# Patient Record
Sex: Male | Born: 1995 | Race: Black or African American | Hispanic: No | Marital: Married | Smoking: Current every day smoker
Health system: Southern US, Community
[De-identification: ages and names within clinical notes are randomized; demographics above are authoritative.]

## PROBLEM LIST (undated history)

## (undated) HISTORY — PX: TONSILLECTOMY: SUR1361

---
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
## 2013-02-11 ENCOUNTER — Emergency Department (HOSPITAL_COMMUNITY)
Admission: EM | Admit: 2013-02-11 | Discharge: 2013-02-11 | Disposition: A | Discharge status: Home / Self Care | Payer: Medicaid Other | Attending: Emergency Medicine | Admitting: Emergency Medicine

## 2013-02-11 ENCOUNTER — Encounter (HOSPITAL_COMMUNITY): Payer: Self-pay | Admitting: Emergency Medicine

## 2013-02-11 DIAGNOSIS — Z23 Encounter for immunization: Secondary | ICD-10-CM | POA: Insufficient documentation

## 2013-02-11 DIAGNOSIS — W261XXA Contact with sword or dagger, initial encounter: Secondary | ICD-10-CM | POA: Insufficient documentation

## 2013-02-11 DIAGNOSIS — W260XXA Contact with knife, initial encounter: Secondary | ICD-10-CM | POA: Insufficient documentation

## 2013-02-11 DIAGNOSIS — Y998 Other external cause status: Secondary | ICD-10-CM | POA: Insufficient documentation

## 2013-02-11 DIAGNOSIS — S61209A Unspecified open wound of unspecified finger without damage to nail, initial encounter: Secondary | ICD-10-CM | POA: Insufficient documentation

## 2013-02-11 DIAGNOSIS — S61219A Laceration without foreign body of unspecified finger without damage to nail, initial encounter: Secondary | ICD-10-CM

## 2013-02-11 DIAGNOSIS — Y929 Unspecified place or not applicable: Secondary | ICD-10-CM | POA: Insufficient documentation

## 2013-02-11 MED ORDER — TETANUS-DIPHTH-ACELL PERTUSSIS 5-2.5-18.5 LF-MCG/0.5 IM SUSP
0.5000 mL | Freq: Once | INTRAMUSCULAR | Status: AC
  Administered 2013-02-11: 0.5 mL via INTRAMUSCULAR
  Filled 2013-02-11: qty 0.5

## 2013-02-11 NOTE — ED Provider Notes (Signed)
History    This chart was scribed for Doran Durand, non-physician practitioner working with Richardean Canal, MD by Leone Payor, ED Scribe. This patient was seen in room TR04C/TR04C and the patient's care was started at 1352.   CSN: [id number]  Arrival date & time 02/11/13  1352   First MD Initiated Contact with Patient 02/11/13 1504      Chief Complaint  Patient presents with  . Extremity Laceration     The history is provided by the patient. No language interpreter was used.    HPI Comments: Cole Hall is a 17 y.o. male who presents to the Emergency Department complaining of a laceration to the L index finger that occurred about 1.5 hours ago. Pt states he was attempting to close a knife blade when the cut happened. He does not remember if tetanus is UTD. He did not try any at-home treatment PTA. He denies any numbness or tingling.  Bleeding controlled at this time.  Full ROM of the finger.   History reviewed. No pertinent past medical history.  History reviewed. No pertinent past surgical history.  No family history on file.  History  Substance Use Topics  . Smoking status: Not on file  . Smokeless tobacco: Not on file  . Alcohol Use: No      Review of Systems  Skin: Positive for wound (laceration ).  Neurological: Negative for numbness.  All other systems reviewed and are negative.    Allergies  Review of patient's allergies indicates no known allergies.  Home Medications  No current outpatient prescriptions on file.  BP 134/70  Pulse 56  Temp(Src) 98.5 F (36.9 C) (Oral)  Resp 15  SpO2 98%  Physical Exam  Nursing note and vitals reviewed. Constitutional: He is oriented to person, place, and time. He appears well-developed and well-nourished. No distress.  HENT:  Head: Normocephalic and atraumatic.  Eyes: Conjunctivae and EOM are normal.  Neck: Normal range of motion.  Cardiovascular: Normal rate, regular rhythm, normal heart sounds and intact  distal pulses.   Pulmonary/Chest: Effort normal and breath sounds normal. No respiratory distress. He has no wheezes. He has no rales.  Musculoskeletal: Normal range of motion.  Good ROM of DIP and PIP of his left index finger. Able to resist.   Neurological: He is alert and oriented to person, place, and time.  Sensation intact.   Skin: Skin is warm and dry. No rash noted. He is not diaphoretic.  1.5 cm V-shaped linear laceration. Good cap refill of the L index finger   Psychiatric: He has a normal mood and affect. His behavior is normal.    ED Course  Procedures (including critical care time)  DIAGNOSTIC STUDIES: Oxygen Saturation is 98% on room air, normal by my interpretation.    COORDINATION OF CARE: 3:28 PM Discussed treatment plan with pt at bedside and pt agreed to plan.   LACERATION REPAIR Performed by: Doran Durand Consent: Verbal consent obtained. Risks and benefits: risks, benefits and alternatives were discussed Patient identity confirmed: provided demographic data Time out performed prior to procedure Prepped and Draped in normal sterile fashion Wound explored Laceration Location: Left index finger pad Laceration Length: 1.5 cm No Foreign Bodies seen or palpated Anesthesia: local infiltration Local anesthetic: lidocaine 2% w/o epinephrine Anesthetic total: 5 ml Irrigation method: syringe Amount of cleaning: standard Skin closure: 4-0 vicryo rapide Number of sutures or staples: 4 Technique: simple, interrupted  Patient tolerance: Patient tolerated the procedure well with no immediate complications.  Labs Reviewed - No data to display No results found.   No diagnosis found.    MDM  Patient presents with a laceration of his left index finger pad.  No tendon involvement.  Neurovascularly intact.  Laceration repaired with sutures.  Patient stable for discharge.  I personally performed the services described in this documentation, which was scribed  in my presence. The recorded information has been reviewed and is accurate.    Pascal Lux Boissevain, PA-C 02/11/13 1737

## 2013-02-11 NOTE — ED Notes (Signed)
Pt. Stated, i was closing a blade and it cut my left finger.

## 2013-02-16 NOTE — ED Provider Notes (Signed)
Medical screening examination/treatment/procedure(s) were performed by non-physician practitioner and as supervising physician I was immediately available for consultation/collaboration.   Richardean Canal, MD 02/16/13 0930

## 2017-05-25 ENCOUNTER — Encounter (HOSPITAL_COMMUNITY): Payer: Self-pay | Admitting: Emergency Medicine

## 2017-05-25 DIAGNOSIS — Z7253 High risk bisexual behavior: Secondary | ICD-10-CM | POA: Insufficient documentation

## 2017-05-25 DIAGNOSIS — Z202 Contact with and (suspected) exposure to infections with a predominantly sexual mode of transmission: Secondary | ICD-10-CM | POA: Insufficient documentation

## 2017-05-25 DIAGNOSIS — F17200 Nicotine dependence, unspecified, uncomplicated: Secondary | ICD-10-CM | POA: Insufficient documentation

## 2017-05-25 LAB — URINALYSIS, ROUTINE W REFLEX MICROSCOPIC
Bilirubin Urine: NEGATIVE
Glucose, UA: NEGATIVE mg/dL
Hgb urine dipstick: NEGATIVE
Ketones, ur: NEGATIVE mg/dL
Leukocytes, UA: NEGATIVE
Nitrite: NEGATIVE
Protein, ur: NEGATIVE mg/dL
SPECIFIC GRAVITY, URINE: 1.025 (ref 1.005–1.030)
pH: 5 (ref 5.0–8.0)

## 2017-05-25 NOTE — ED Triage Notes (Signed)
Pt wants to be checked for STDs, denies symptoms, concerned for exposure.

## 2017-05-26 ENCOUNTER — Emergency Department (HOSPITAL_COMMUNITY)
Admission: EM | Admit: 2017-05-26 | Discharge: 2017-05-26 | Disposition: A | Discharge status: Home / Self Care | Payer: Self-pay | Attending: Emergency Medicine | Admitting: Emergency Medicine

## 2017-05-26 DIAGNOSIS — Z7251 High risk heterosexual behavior: Secondary | ICD-10-CM

## 2017-05-26 LAB — GC/CHLAMYDIA PROBE AMP (~~LOC~~) NOT AT ARMC
Chlamydia: NEGATIVE
Neisseria Gonorrhea: NEGATIVE

## 2017-05-26 LAB — HIV ANTIBODY (ROUTINE TESTING W REFLEX): HIV Screen 4th Generation wRfx: NONREACTIVE

## 2017-05-26 LAB — RPR: RPR: NONREACTIVE

## 2017-05-26 NOTE — Discharge Instructions (Signed)
Your urine today was not concerning for possible STD exposure. You have STD tests pending. Follow-up with the health department in 48 hours for the results of your STD tests. However, you WILL receive a call if your STD results return POSITIVE. If you test positive for STDs, notify all sexual partners of their need to be tested and treated as well. Use a condom when sexually active.

## 2017-05-26 NOTE — ED Provider Notes (Signed)
[facility]-EMERGENCY DEPT Provider Note   CSN: [id number] Arrival date & time: 05/25/17  2202     History   Chief Complaint Chief Complaint  Patient presents with  . Exposure to STD    HPI Cole Hall is a 21 y.o. male.  21 year old male presents to the emergency department for concern of STD exposure. He states that he was sexually active with a partner 3-4 months ago and was notified today that she may be positive for an STD. Patient reports intercourse to be unprotected. Patient denies any symptoms today. He has no abdominal pain, fever, nausea, vomiting, genital sores or lesions, penile discharge, dysuria, testicular tenderness, or scrotal swelling.      History reviewed. No pertinent past medical history.  There are no active problems to display for this patient.   History reviewed. No pertinent surgical history.     Home Medications    Prior to Admission medications   Not on File    Family History No family history on file.  Social History Social History  Substance Use Topics  . Smoking status: Current Every Day Smoker  . Smokeless tobacco: Never Used  . Alcohol use No     Allergies   Patient has no known allergies.   Review of Systems Review of Systems Ten systems reviewed and are negative for acute change, except as noted in the HPI.    Physical Exam Updated Vital Signs BP 135/88 (BP Location: Left Arm)   Pulse (!) 49   Temp 98.4 F (36.9 C) (Oral)   Resp 16   Ht  (1.727 m)   Wt 74.8 kg (165 lb)   SpO2 95%   BMI 25.09 kg/m   Physical Exam  Constitutional: He is oriented to person, place, and time. He appears well-developed and well-nourished. No distress.  HENT:  Head: Normocephalic and atraumatic.  Eyes: Conjunctivae and EOM are normal. No scleral icterus.  Neck: Normal range of motion.  Pulmonary/Chest: Effort normal. No respiratory distress.  Genitourinary: Right testis shows no mass, no swelling and no tenderness. Right  testis is descended. Left testis shows no mass, no swelling and no tenderness. Left testis is descended. Circumcised. No discharge found.  Genitourinary Comments: Normal genital exam; chaperoned by Mitzie Na, RN.  Musculoskeletal: Normal range of motion.  Lymphadenopathy: No inguinal adenopathy noted on the right or left side.  Neurological: He is alert and oriented to person, place, and time.  Skin: Skin is warm and dry. No rash noted. He is not diaphoretic. No erythema. No pallor.  Psychiatric: He has a normal mood and affect. His behavior is normal.  Nursing note and vitals reviewed.    ED Treatments / Results  Labs (all labs ordered are listed, but only abnormal results are displayed) Labs Reviewed  URINALYSIS, ROUTINE W REFLEX MICROSCOPIC  RPR  HIV ANTIBODY (ROUTINE TESTING)  GC/CHLAMYDIA PROBE AMP ([place]) NOT AT [facility]    EKG  EKG Interpretation None       Radiology No results found.  Procedures Procedures (including critical care time)  Medications Ordered in ED Medications - No data to display   Initial Impression / Assessment and Plan / ED Course  I have reviewed the triage vital signs and the nursing notes.  Pertinent labs & imaging results that were available during my care of the patient were reviewed by me and considered in my medical decision making (see chart for details).     21 year old male presents after an unprotected sexual  encounter for STD check. He has no signs of acute infection. UA negative for pyuria. No indication for prophylactic management. STD tests are pending. The patient is aware of his need to tell all sexual partners of their need to be tested and treated for STDs as well. He has been counseled on the use of protection during intercourse. Patient to follow-up with the Health Department regarding the results of his STD tests. Return precautions discussed and provided. Patient discharged in stable condition with no unaddressed  concerns.   Final Clinical Impressions(s) / ED Diagnoses   Final diagnoses:  History of unprotected sex    New Prescriptions There are no discharge medications for this patient.    Antony Madura, PA-C 05/26/17 Aniceto Boss    Zadie Rhine, MD 05/26/17 [phone]

## 2018-07-26 ENCOUNTER — Emergency Department (HOSPITAL_COMMUNITY)
Admission: EM | Admit: 2018-07-26 | Discharge: 2018-07-27 | Disposition: A | Discharge status: Home / Self Care | Attending: Emergency Medicine | Admitting: Emergency Medicine

## 2018-07-26 ENCOUNTER — Encounter (HOSPITAL_COMMUNITY): Payer: Self-pay

## 2018-07-26 ENCOUNTER — Emergency Department (HOSPITAL_COMMUNITY)

## 2018-07-26 DIAGNOSIS — R079 Chest pain, unspecified: Secondary | ICD-10-CM | POA: Diagnosis present

## 2018-07-26 DIAGNOSIS — F17200 Nicotine dependence, unspecified, uncomplicated: Secondary | ICD-10-CM | POA: Diagnosis not present

## 2018-07-26 DIAGNOSIS — I493 Ventricular premature depolarization: Secondary | ICD-10-CM | POA: Diagnosis not present

## 2018-07-26 LAB — CBC
HCT: 46.7 % (ref 39.0–52.0)
Hemoglobin: 15.5 g/dL (ref 13.0–17.0)
MCH: 31.7 pg (ref 26.0–34.0)
MCHC: 33.2 g/dL (ref 30.0–36.0)
MCV: 95.5 fL (ref 80.0–100.0)
NRBC: 0 % (ref 0.0–0.2)
Platelets: 234 10*3/uL (ref 150–400)
RBC: 4.89 MIL/uL (ref 4.22–5.81)
RDW: 11.7 % (ref 11.5–15.5)
WBC: 10 10*3/uL (ref 4.0–10.5)

## 2018-07-26 LAB — BASIC METABOLIC PANEL
ANION GAP: 12 (ref 5–15)
BUN: 8 mg/dL (ref 6–20)
CO2: 24 mmol/L (ref 22–32)
Calcium: 9.6 mg/dL (ref 8.9–10.3)
Chloride: 104 mmol/L (ref 98–111)
Creatinine, Ser: 1.11 mg/dL (ref 0.61–1.24)
GFR calc Af Amer: >60 mL/min (ref >60)
GLUCOSE: 99 mg/dL (ref 70–99)
POTASSIUM: 3 mmol/L — AB (ref 3.5–5.1)
Sodium: 140 mmol/L (ref 135–145)

## 2018-07-26 LAB — I-STAT TROPONIN, ED: Troponin i, poc: 0.03 ng/mL (ref 0.00–0.08)

## 2018-07-26 MED ORDER — POTASSIUM CHLORIDE CRYS ER 20 MEQ PO TBCR
60.0000 meq | EXTENDED_RELEASE_TABLET | Freq: Once | ORAL | Status: AC
  Administered 2018-07-27: 60 meq via ORAL
  Filled 2018-07-26: qty 3

## 2018-07-26 NOTE — ED Provider Notes (Signed)
[facility]   CSN: [id number] Arrival date & time: 07/26/18  2038     History   Chief Complaint Chief Complaint  Patient presents with  . Chest Pain    HPI Queen Sloughhomas G Minnich is a 22 y.o. male.  HPI  22 year old male comes in with chief complaint of chest pain.  Patient reports that he was watching television earlier in the evening when he suddenly started having chest pain.  Chest pain is described as midsternal pain and it was preceded by palpitations.  Patient states that his symptoms lasted for several minutes.  Patient had associated dizziness but no near fainting, and he denies any nausea, shortness of breath.  Patient's father had a heart attack in his 2940s otherwise family history is mostly unremarkable.  Patient admits to heavy drinking over the weekends and he denies any substance abuse.   History reviewed. No pertinent past medical history.  There are no active problems to display for this patient.   Past Surgical History:  Procedure Laterality Date  . TONSILLECTOMY          Home Medications    Prior to Admission medications   Not on File    Family History No family history on file.  Social History Social History   Tobacco Use  . Smoking status: Current Every Day Smoker  . Smokeless tobacco: Never Used  Substance Use Topics  . Alcohol use: No  . Drug use: No     Allergies   Patient has no known allergies.   Review of Systems Review of Systems  Constitutional: Positive for activity change.  Respiratory: Negative for shortness of breath.   Cardiovascular: Positive for chest pain and palpitations.  Gastrointestinal: Positive for nausea.  All other systems reviewed and are negative.    Physical Exam Updated Vital Signs BP 134/80   Pulse (!) 49   Temp 98.5 F (36.9 C) (Oral)   Resp (!) 21   SpO2 99%   Physical Exam  Constitutional: He is oriented to person, place, and time. He  appears well-developed.  HENT:  Head: Atraumatic.  Neck: Neck supple.  Cardiovascular: Intact distal pulses and normal pulses. An irregularly irregular rhythm present.  Pulmonary/Chest: Effort normal. Tachypneic:   Neurological: He is alert and oriented to person, place, and time.  Skin: Skin is warm.  Nursing note and vitals reviewed.    ED Treatments / Results  Labs (all labs ordered are listed, but only abnormal results are displayed) Labs Reviewed  BASIC METABOLIC PANEL - Abnormal; Notable for the following components:      Result Value   Potassium 3.0 (*)    All other components within normal limits  CBC  MAGNESIUM  MAGNESIUM  I-STAT TROPONIN, ED  I-STAT TROPONIN, ED    EKG EKG Interpretation  Date/Time:  Wednesday July 26 2018 20:49:39 EST Ventricular Rate:  86 PR Interval:  140 QRS Duration: 106 QT Interval:  358 QTC Calculation: 428 R Axis:   67 Text Interpretation:  Sinus rhythm with sinus arrhythmia with occasional Premature ventricular complexes Nonspecific T wave abnormality Abnormal ECG No acute changes lateral leads have new TWI Confirmed by Derwood Kaplananavati, Carrell Palmatier [phone]([id number]) on 07/26/2018 11:07:41 PM   Radiology Dg Chest 2 View  Result Date: 07/26/2018 CLINICAL DATA:  Chest pain. Nausea and shortness of breath. EXAM: CHEST - 2 VIEW COMPARISON:  None. FINDINGS: The cardiomediastinal contours are normal. The lungs are clear. Pulmonary vasculature is normal. No consolidation, pleural  effusion, or pneumothorax. No acute osseous abnormalities are seen. IMPRESSION: Negative radiographs of the chest Electronically Signed   By: Narda Rutherford M.D.   On: 07/26/2018 21:10    Procedures Procedures (including critical care time)  Medications Ordered in ED Medications  potassium chloride SA (K-DUR,KLOR-CON) CR tablet 60 mEq (60 mEq Oral Given 07/27/18 0023)     Initial Impression / Assessment and Plan / ED Course  I have reviewed the triage vital signs and the  nursing notes.  Pertinent labs & imaging results that were available during my care of the patient were reviewed by me and considered in my medical decision making (see chart for details).     22 year old male comes in with chief complaint of chest discomfort.  Patient reports that he had sudden onset of palpitations followed by chest discomfort, his symptoms lasted for several minutes.  Chest pain is described as tightness and it was located midsternally.  Patient is now chest pain-free.  He is in the [employer] and denies any significant medical problems.  He does indicate that he binge drinks over the weekend.  Family history is unremarkable besides his father having heart disease in his upper 44s.   EKG shows T wave inversions in the lateral leads and he is noted to have PVCs. Potassium is slightly low, we will give him oral K.\  1:49 AM Results from the ER workup discussed with the patient face to face and all questions answered to the best of my ability.  Pt will be discharged if trop is neg.   Final Clinical Impressions(s) / ED Diagnoses   Final diagnoses:  PVC (premature ventricular contraction)  Nonspecific chest pain    ED Discharge Orders    None       Derwood Kaplan, MD 07/27/18 0149

## 2018-07-26 NOTE — ED Triage Notes (Signed)
Pt states that he began to feel like his heart was racing about 30 minutes ago with some nausea and SOB. Pt reports feeling anxious. CP is central

## 2018-07-27 LAB — MAGNESIUM: MAGNESIUM: 2.4 mg/dL (ref 1.7–2.4)

## 2018-07-27 LAB — I-STAT TROPONIN, ED: Troponin i, poc: 0 ng/mL (ref 0.00–0.08)

## 2018-07-27 MED ORDER — SODIUM CHLORIDE 0.9 % IV SOLN: INTRAVENOUS | Status: DC | PRN

## 2018-07-27 NOTE — ED Notes (Signed)
Patient verbalizes understanding of medications and discharge instructions. No further questions at this time. VSS and patient ambulatory at discharge.   

## 2018-07-27 NOTE — Discharge Instructions (Addendum)
We saw you in the ER for the palpitations. We noted that you have extra beats. We are unsure what rhythm you had when you had chest palpitations. If the symptoms recur, we would recommend that you see a primary care doctor or specialist who can consider Holter monitoring.

## 2020-06-23 IMAGING — CR DG CHEST 2V
2 series · 2 of 2 positions shown · non-contrast
Comparison: None.

CLINICAL DATA: Chest pain. Nausea and shortness of breath.

EXAM:
CHEST - 2 VIEW

[chest lat]
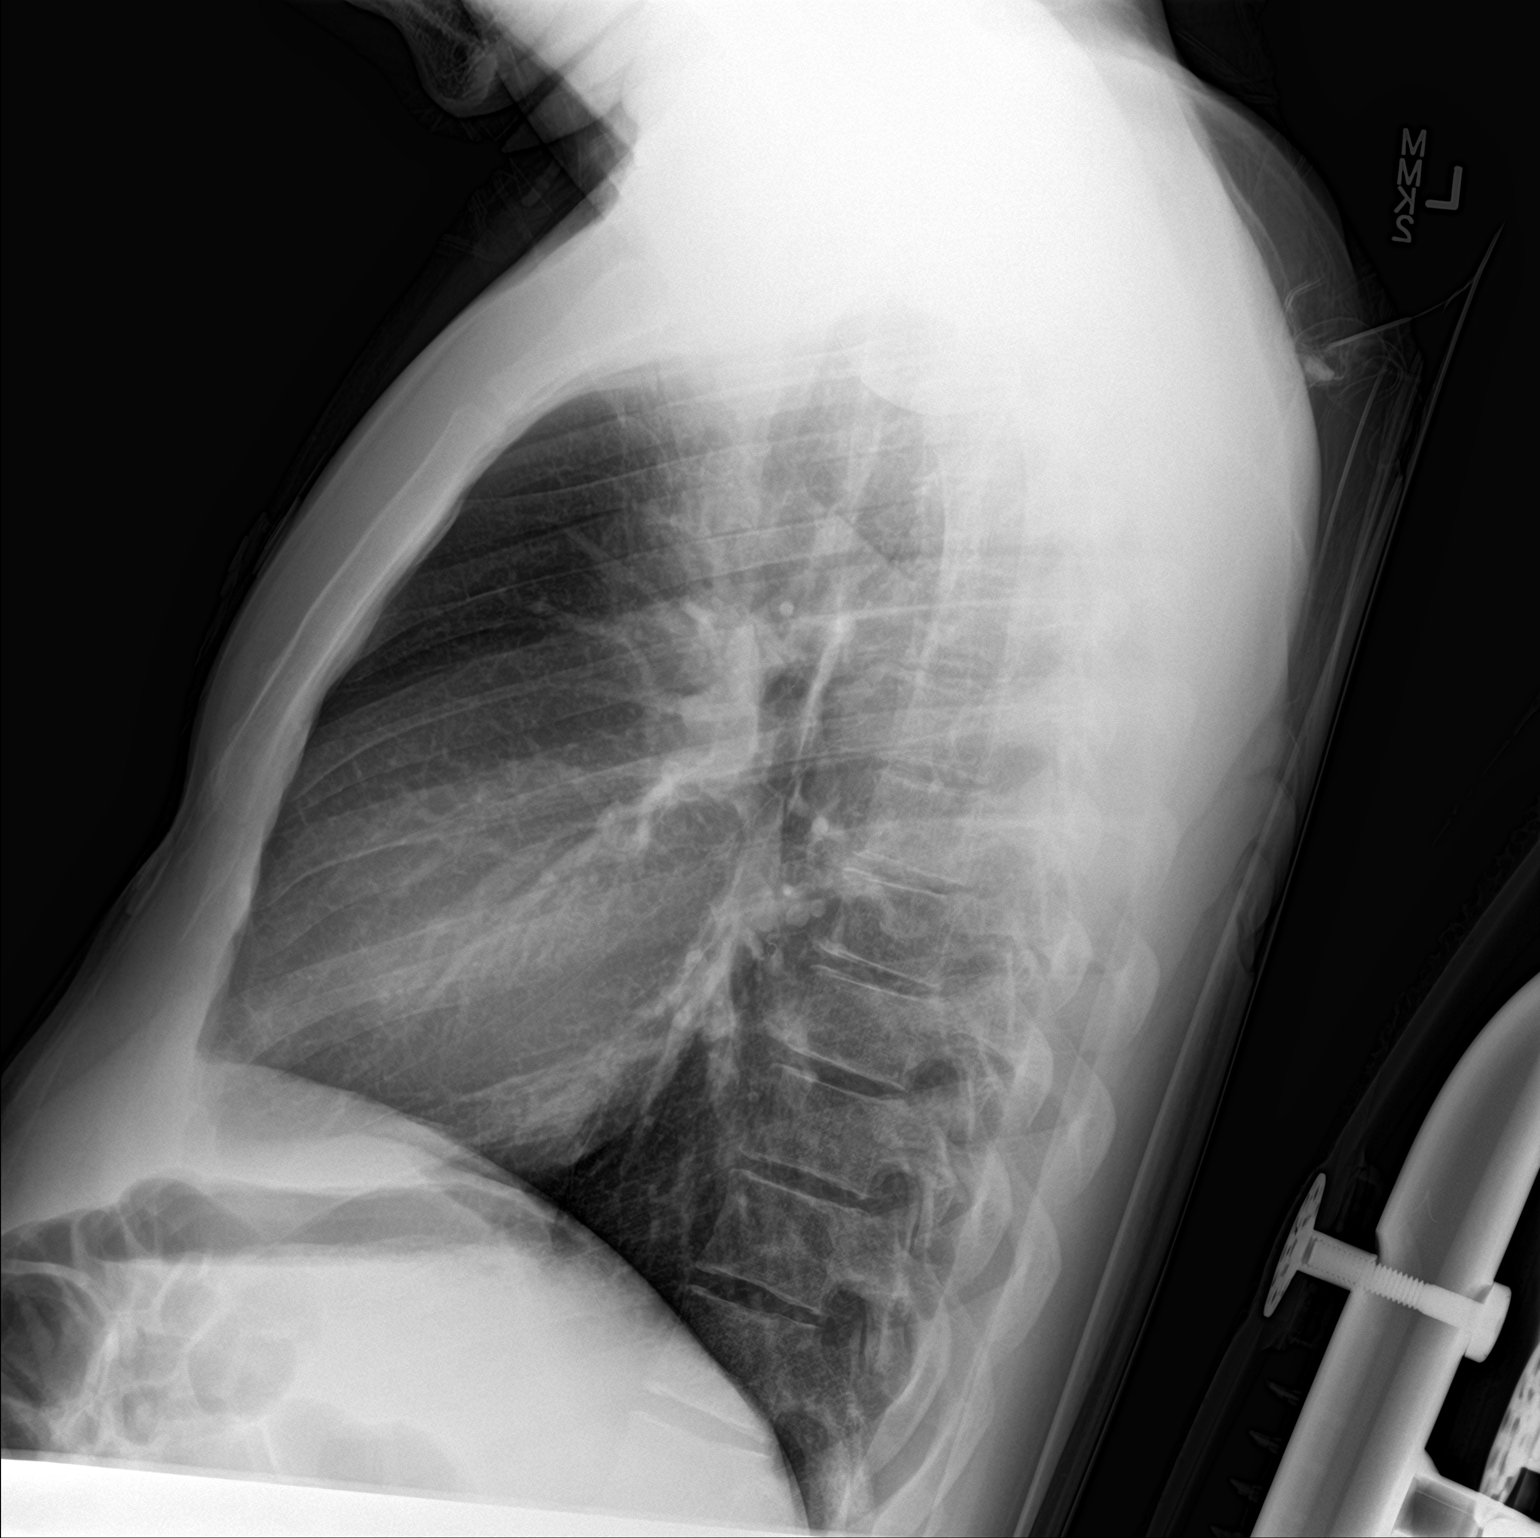

[chest ap]
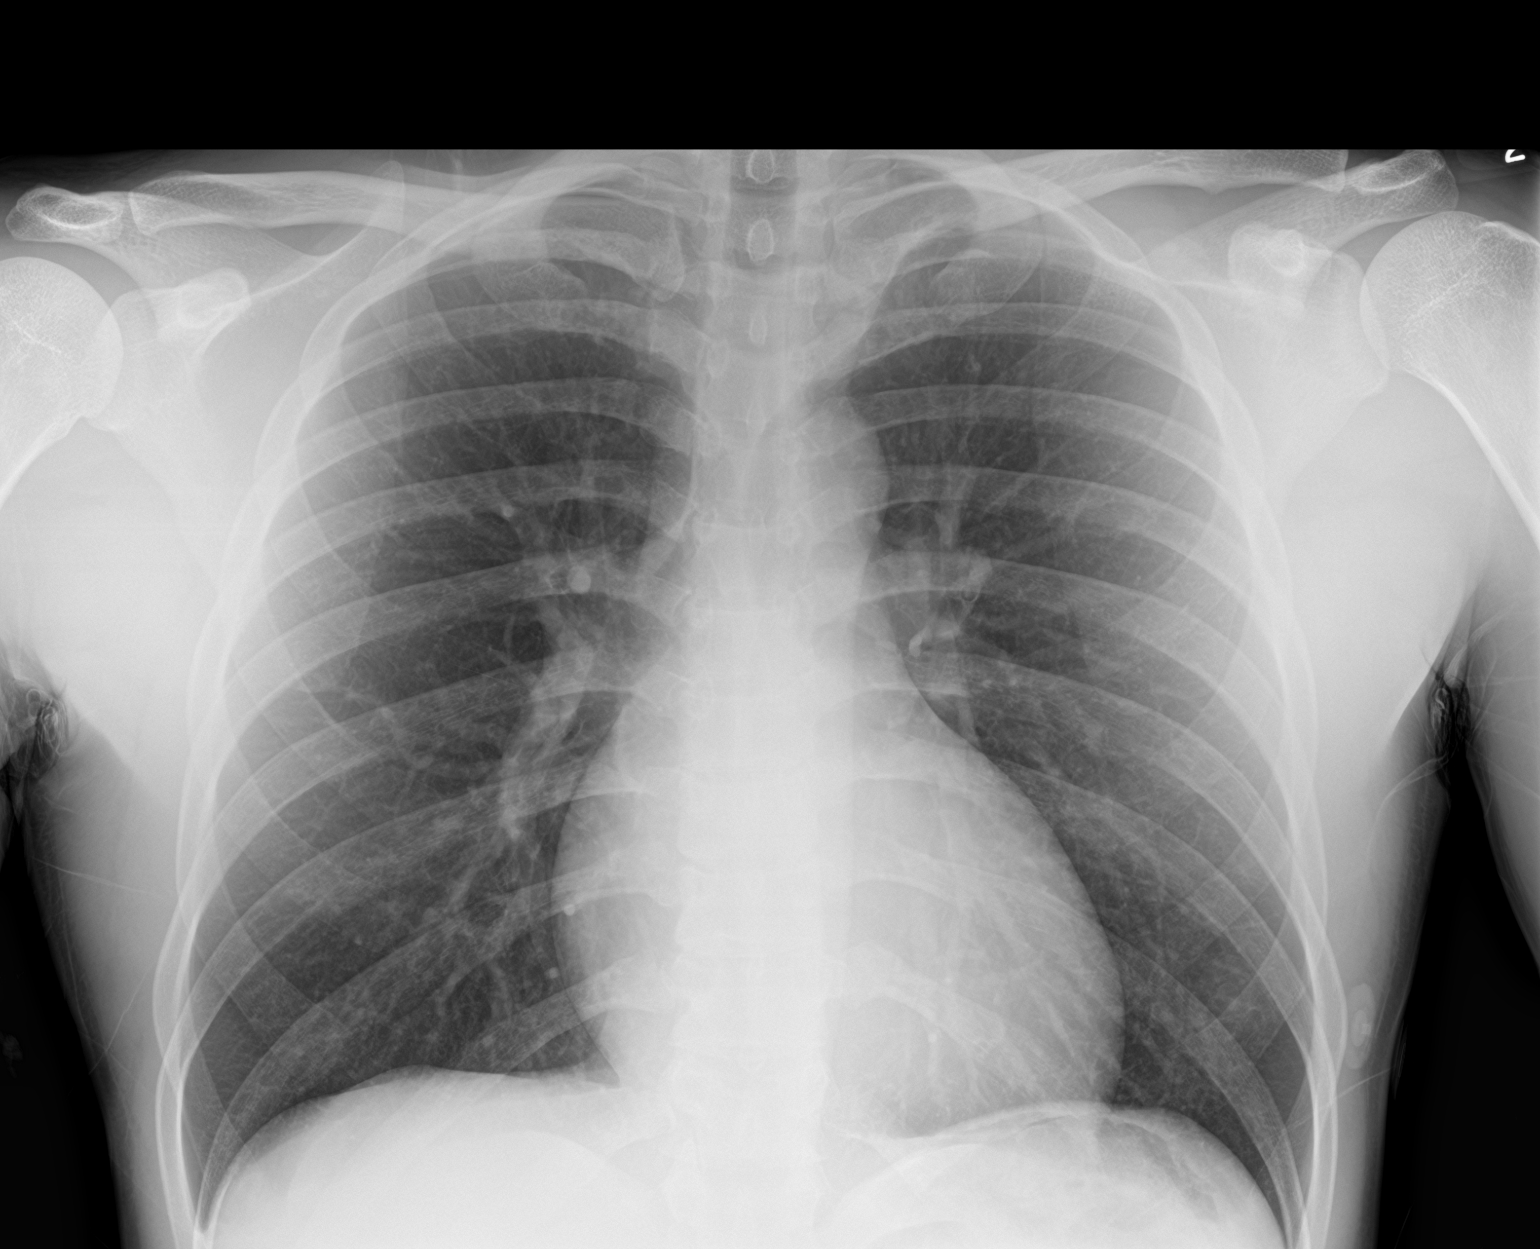

[2 of 2 positions shown; findings below may reference images not displayed]

FINDINGS: The cardiomediastinal contours are normal. The lungs are clear.
Pulmonary vasculature is normal. No consolidation, pleural effusion,
or pneumothorax. No acute osseous abnormalities are seen.
IMPRESSION: Negative radiographs of the chest
